# Patient Record
Sex: Male | Born: 2000 | Hispanic: No | Marital: Single | Smoking: Never smoker
Health system: Southern US, Community
[De-identification: ages and names within clinical notes are randomized; demographics above are authoritative.]

---
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
## 2019-02-21 IMAGING — CR DG ANKLE COMPLETE 3+V*R*
3 series · 3 of 3 positions shown · non-contrast
Comparison: None.

CLINICAL DATA: Right ankle pain after fall. Twisting injury playing
basketball with pain and swelling.

EXAM:
RIGHT ANKLE - COMPLETE 3+ VIEW

[x ankle ap right]
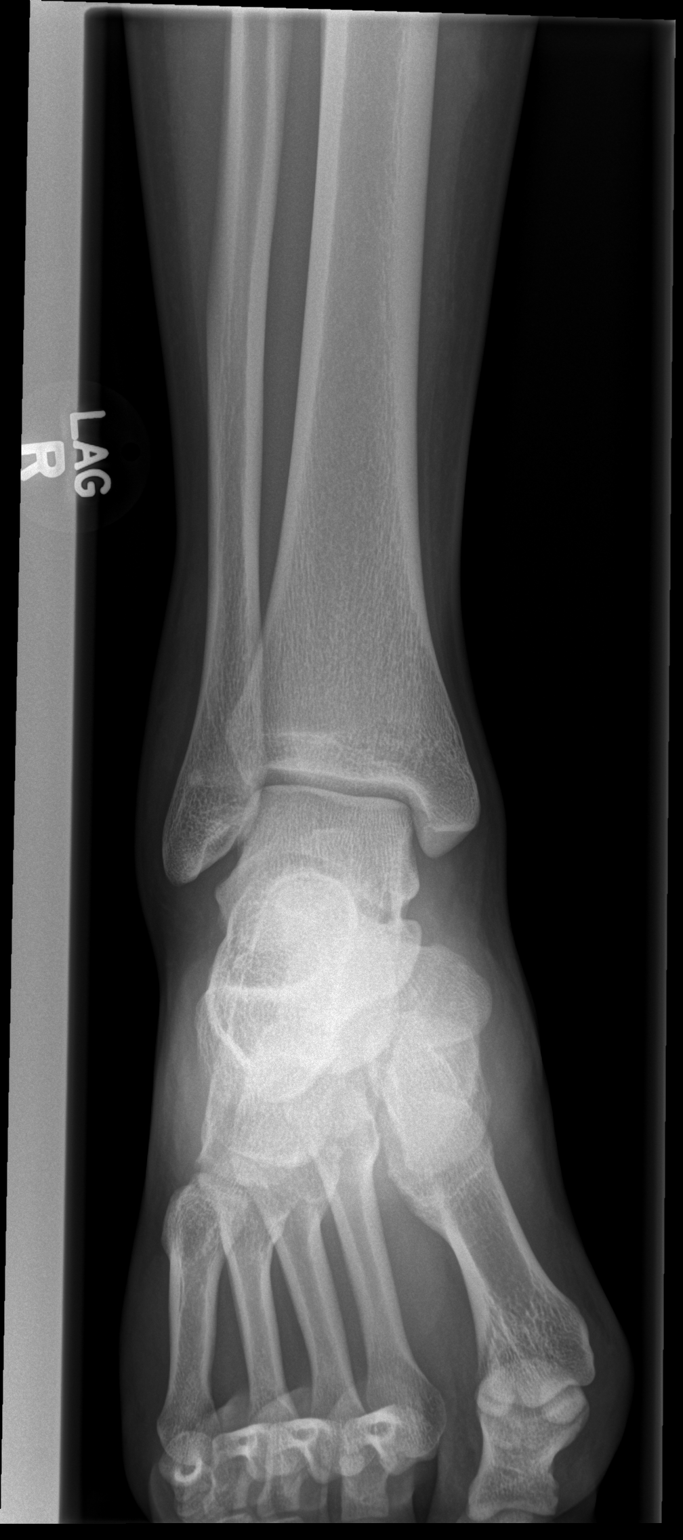

[x ankle obl right]
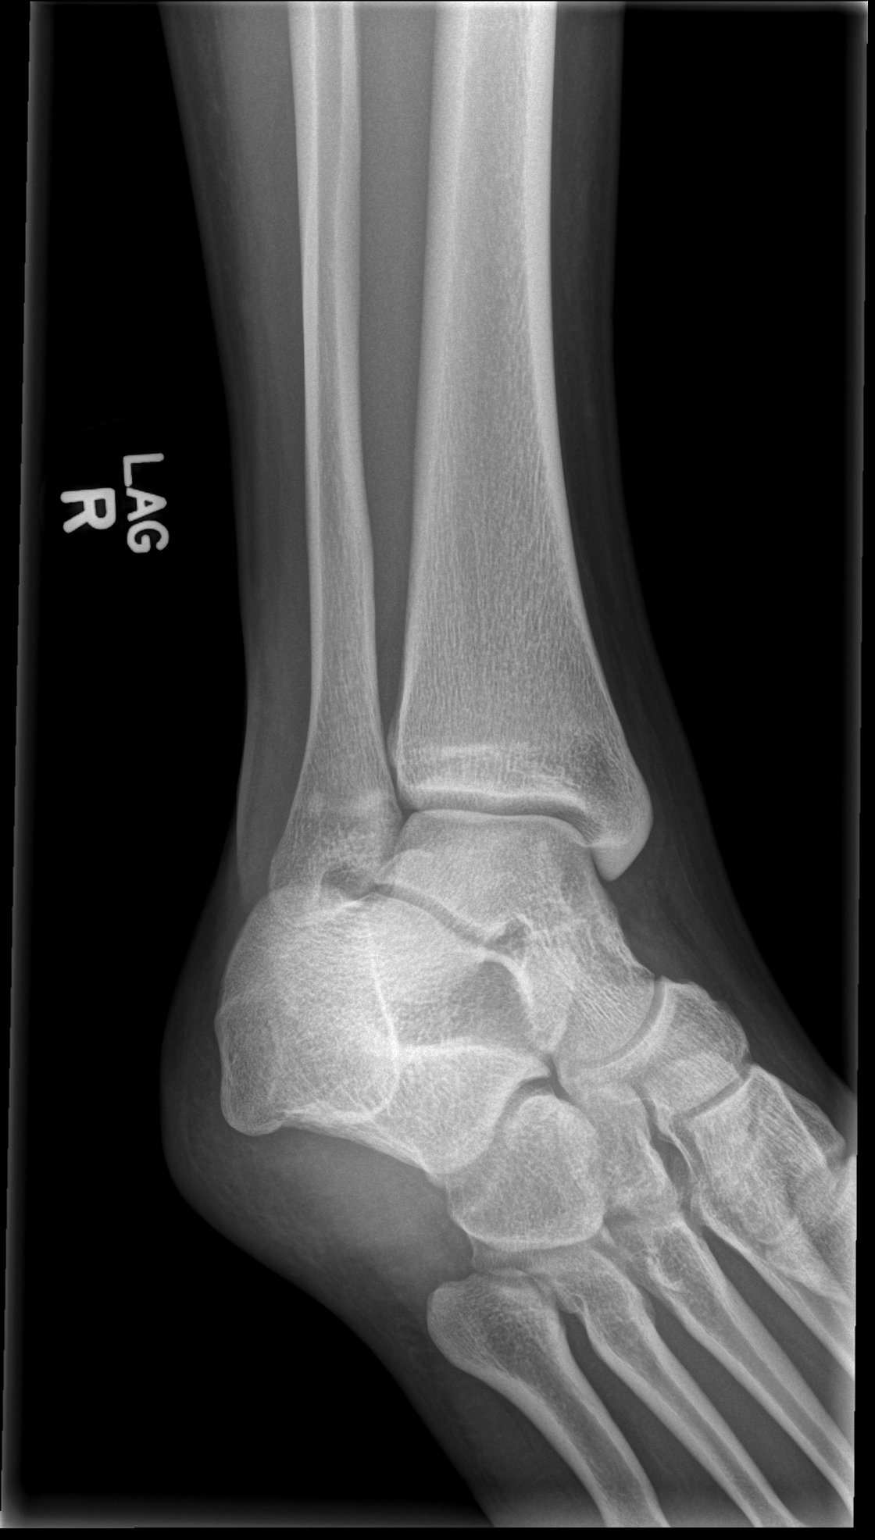

[x ankle lat right]
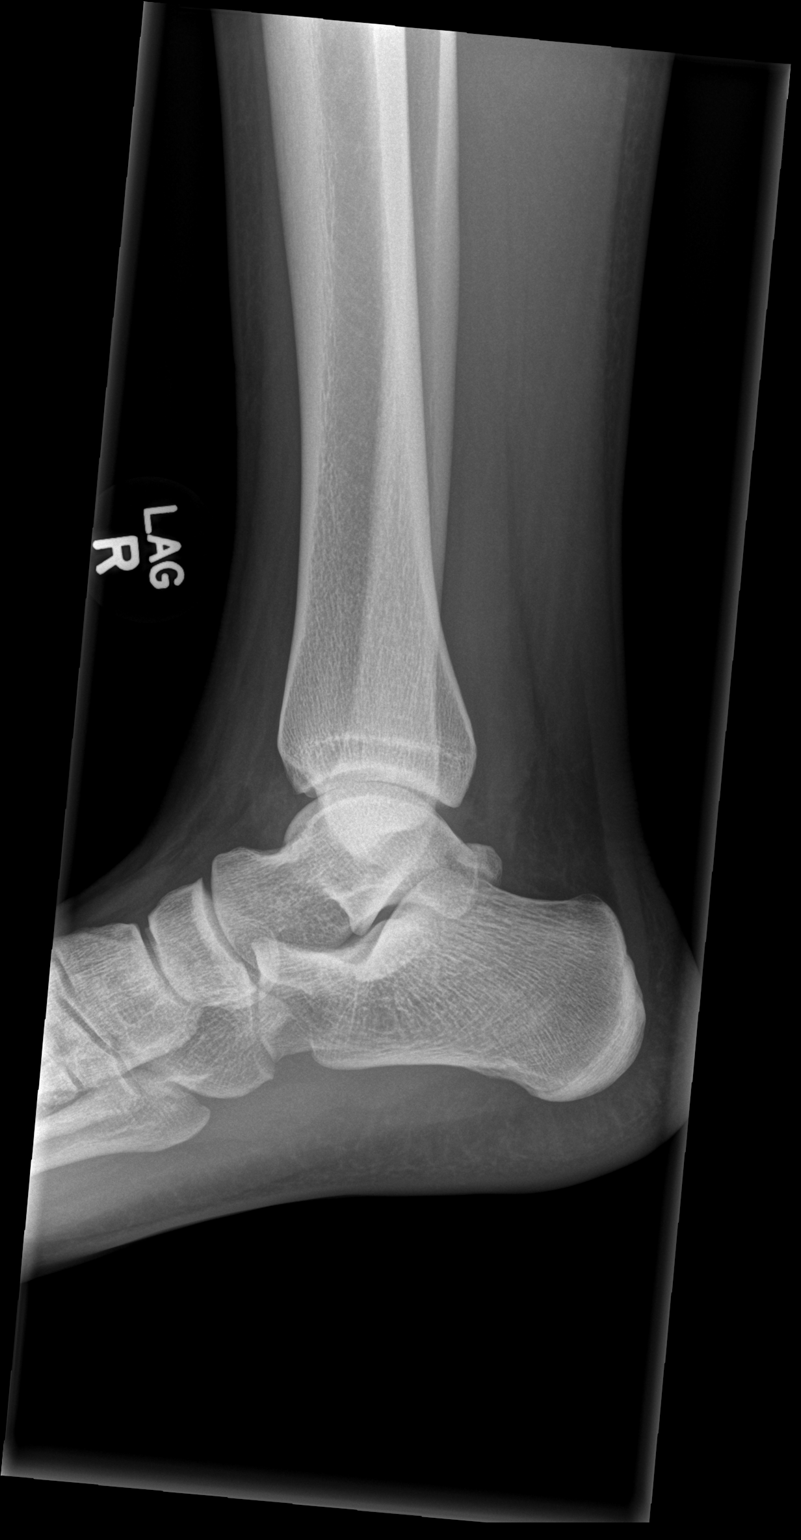

[3 of 3 positions shown; findings below may reference images not displayed]

FINDINGS: Questionable oblique nondisplaced fracture of the lateral malleolus
at the level of the ankle mortise. No additional acute fracture.
Ankle mortise is preserved. No definite tibial talar joint effusion.
There is no evidence of arthropathy. Lateral soft tissue edema.
IMPRESSION: Questionable nondisplaced fracture of the lateral malleolus with
adjacent soft tissue edema.
